# Patient Record
Sex: Male | Born: 1955 | Race: White | Hispanic: No | Marital: Married | Smoking: Never smoker
Health system: Southern US, Community
[De-identification: ages and names within clinical notes are randomized; demographics above are authoritative.]

## PROBLEM LIST (undated history)

## (undated) DIAGNOSIS — D049 Carcinoma in situ of skin, unspecified: Secondary | ICD-10-CM

## (undated) DIAGNOSIS — R040 Epistaxis: Secondary | ICD-10-CM

## (undated) DIAGNOSIS — M1990 Unspecified osteoarthritis, unspecified site: Secondary | ICD-10-CM

## (undated) DIAGNOSIS — N400 Enlarged prostate without lower urinary tract symptoms: Secondary | ICD-10-CM

## (undated) DIAGNOSIS — I10 Essential (primary) hypertension: Secondary | ICD-10-CM

## (undated) DIAGNOSIS — N200 Calculus of kidney: Secondary | ICD-10-CM

## (undated) DIAGNOSIS — K219 Gastro-esophageal reflux disease without esophagitis: Secondary | ICD-10-CM

## (undated) HISTORY — DX: Calculus of kidney: N20.0

## (undated) HISTORY — DX: Carcinoma in situ of skin, unspecified: D04.9

## (undated) HISTORY — PX: WISDOM TOOTH EXTRACTION: SHX21

## (undated) HISTORY — DX: Essential (primary) hypertension: I10

## (undated) HISTORY — PX: BASAL CELL CARCINOMA EXCISION: SHX1214

## (undated) HISTORY — DX: Epistaxis: R04.0

---

## 2012-09-16 ENCOUNTER — Encounter (HOSPITAL_COMMUNITY)
Admission: RE | Admit: 2012-09-16 | Discharge: 2012-09-16 | Disposition: A | Discharge status: Home / Self Care | Payer: BC Managed Care – PPO | Source: Ambulatory Visit | Attending: Urology | Admitting: Urology

## 2012-09-16 ENCOUNTER — Ambulatory Visit (HOSPITAL_COMMUNITY)
Admission: RE | Admit: 2012-09-16 | Discharge: 2012-09-16 | Disposition: A | Discharge status: Home / Self Care | Payer: BC Managed Care – PPO | Source: Ambulatory Visit | Attending: Urology | Admitting: Urology

## 2012-09-16 ENCOUNTER — Other Ambulatory Visit: Payer: Self-pay

## 2012-09-16 ENCOUNTER — Other Ambulatory Visit (HOSPITAL_COMMUNITY): Payer: Self-pay | Admitting: Urology

## 2012-09-16 ENCOUNTER — Encounter (HOSPITAL_COMMUNITY): Payer: Self-pay

## 2012-09-16 ENCOUNTER — Encounter (HOSPITAL_COMMUNITY): Payer: Self-pay | Admitting: Pharmacy Technician

## 2012-09-16 DIAGNOSIS — N201 Calculus of ureter: Secondary | ICD-10-CM

## 2012-09-16 DIAGNOSIS — R1032 Left lower quadrant pain: Secondary | ICD-10-CM | POA: Insufficient documentation

## 2012-09-16 HISTORY — DX: Benign prostatic hyperplasia without lower urinary tract symptoms: N40.0

## 2012-09-16 HISTORY — DX: Gastro-esophageal reflux disease without esophagitis: K21.9

## 2012-09-16 HISTORY — DX: Unspecified osteoarthritis, unspecified site: M19.90

## 2012-09-16 LAB — BASIC METABOLIC PANEL
Calcium: 9.8 mg/dL (ref 8.4–10.5)
GFR calc non Af Amer: 67 mL/min — ABNORMAL LOW (ref >90)
Glucose, Bld: 141 mg/dL — ABNORMAL HIGH (ref 70–99)
Sodium: 138 mEq/L (ref 135–145)

## 2012-09-16 LAB — SURGICAL PCR SCREEN: Staphylococcus aureus: NEGATIVE

## 2012-09-16 NOTE — Patient Instructions (Addendum)
Avan Gullett  09/16/2012   Your procedure is scheduled on:  09/17/12  Report to [facility] at 0700 AM.  Call this number if you have problems the morning of surgery: [phone]   Remember:   Do not eat food or drink liquids after midnight.   Take these medicines the morning of surgery with A SIP OF WATER: pain pill, prilosec   Do not wear jewelry, make-up or nail polish.  Do not wear lotions, powders, or perfumes. You may wear deodorant.  Do not shave 48 hours prior to surgery. Men may shave face and neck.  Do not bring valuables to the hospital.  [facility] is not responsible                   for any belongings or valuables.  Contacts, dentures or bridgework may not be worn into surgery.  Leave suitcase in the car. After surgery it may be brought to your room.  For patients admitted to the hospital, checkout time is 11:00 AM the day of  discharge.   Patients discharged the day of surgery will not be allowed to drive  home.  Name and phone number of your driver: family  Special Instructions: Shower using CHG 2 nights before surgery and the night before surgery.  If you shower the day of surgery use CHG.  Use special wash - you have one bottle of CHG for all showers.  You should use approximately 1/3 of the bottle for each shower.   Please read over the following fact sheets that you were given: Pain Booklet, Coughing and Deep Breathing, MRSA Information, Surgical Site Infection Prevention, Anesthesia Post-op Instructions and Care and Recovery After Surgery   PATIENT INSTRUCTIONS POST-ANESTHESIA  IMMEDIATELY FOLLOWING SURGERY:  Do not drive or operate machinery for the first twenty four hours after surgery.  Do not make any important decisions for twenty four hours after surgery or while taking narcotic pain medications or sedatives.  If you develop intractable nausea and vomiting or a severe headache please notify your doctor immediately.  FOLLOW-UP:  Please make an appointment with your  surgeon as instructed. You do not need to follow up with anesthesia unless specifically instructed to do so.  WOUND CARE INSTRUCTIONS (if applicable):  Keep a dry clean dressing on the anesthesia/puncture wound site if there is drainage.  Once the wound has quit draining you may leave it open to air.  Generally you should leave the bandage intact for twenty four hours unless there is drainage.  If the epidural site drains for more than 36-48 hours please call the anesthesia department.  QUESTIONS?:  Please feel free to call your physician or the hospital operator if you have any questions, and they will be happy to assist you.      Cystoscopy Cystoscopy is a procedure that is used to help your caregiver diagnose and sometimes treat conditions that affect your lower urinary tract. Your lower urinary tract includes your bladder and the tube through which urine passes from your bladder out of your body (urethra). Cystoscopy is performed with a thin, tube-shaped instrument (cystoscope). The cystoscope has lenses and a light at the end so that your caregiver can see inside your bladder. The cystoscope is inserted at the entrance of your urethra. Your caregiver guides it through your urethra and into your bladder. There are two main types of cystoscopy:  Flexible cystoscopy (with a flexible cystoscope).  Rigid cystoscopy (with a rigid cystoscope). Cystoscopy may be recommended  for many conditions, including:  Urinary tract infections.  Blood in your urine (hematuria).  Loss of bladder control (urinary incontinence) or overactive bladder.  Unusual cells found in a urine sample.  Urinary blockage.  Painful urination. Cystoscopy may also be done to remove a sample of your tissue to be checked under a microscope (biopsy). It may also be done to remove or destroy bladder stones. LET YOUR CAREGIVER KNOW ABOUT:  Allergies to food or medicine.  Medicines taken, including vitamins, herbs, eyedrops,  over-the-counter medicines, and creams.  Use of steroids (by mouth or creams).  Previous problems with anesthetics or numbing medicines.  History of bleeding problems or blood clots.  Previous surgery.  Other health problems, including diabetes and kidney problems.  Possibility of pregnancy, if this applies. PROCEDURE The area around the opening to your urethra will be cleaned. A medicine to numb your urethra (local anesthetic) is used. If a tissue sample or stone is removed during the procedure, you may be given a medicine to make you sleep (general anesthetic). Your caregiver will gently insert the tip of the cystoscope into your urethra. The cystoscope will be slowly glided through your urethra and into your bladder. Sterile fluid will flow through the cystoscope and into your bladder. The fluid will expand and stretch your bladder. This gives your caregiver a better view of your bladder walls. The procedure lasts about 15 20 minutes. AFTER THE PROCEDURE If a local anesthetic is used, you will be allowed to go home as soon as you are ready. If a general anesthetic is used, you will be taken to a recovery area until you are stable. You may have temporary bleeding and burning on urination. Document Released: 03/31/2000 Document Revised: 12/27/2011 Document Reviewed: 09/25/2011 [facility] Patient Information 2014 [place], [place].

## 2012-09-17 ENCOUNTER — Ambulatory Visit (HOSPITAL_COMMUNITY): Payer: BC Managed Care – PPO | Admitting: Anesthesiology

## 2012-09-17 ENCOUNTER — Ambulatory Visit (HOSPITAL_COMMUNITY): Payer: BC Managed Care – PPO

## 2012-09-17 ENCOUNTER — Encounter (HOSPITAL_COMMUNITY): Payer: Self-pay | Admitting: Anesthesiology

## 2012-09-17 ENCOUNTER — Encounter (HOSPITAL_COMMUNITY)
Admission: RE | Disposition: A | Discharge status: Home / Self Care | Payer: Self-pay | Source: Ambulatory Visit | Attending: Urology

## 2012-09-17 ENCOUNTER — Ambulatory Visit (HOSPITAL_COMMUNITY)
Admission: RE | Admit: 2012-09-17 | Discharge: 2012-09-17 | Disposition: A | Discharge status: Home / Self Care | Payer: BC Managed Care – PPO | Source: Ambulatory Visit | Attending: Urology | Admitting: Urology

## 2012-09-17 ENCOUNTER — Encounter (HOSPITAL_COMMUNITY): Payer: Self-pay | Admitting: *Deleted

## 2012-09-17 DIAGNOSIS — Z01812 Encounter for preprocedural laboratory examination: Secondary | ICD-10-CM | POA: Insufficient documentation

## 2012-09-17 DIAGNOSIS — N201 Calculus of ureter: Secondary | ICD-10-CM | POA: Insufficient documentation

## 2012-09-17 HISTORY — PX: HOLMIUM LASER APPLICATION: SHX5852

## 2012-09-17 HISTORY — PX: STONE EXTRACTION WITH BASKET: SHX5318

## 2012-09-17 HISTORY — PX: CYSTOSCOPY W/ URETERAL STENT PLACEMENT: SHX1429

## 2012-09-17 HISTORY — PX: BALLOON DILATION: SHX5330

## 2012-09-17 HISTORY — PX: URETEROSCOPY: SHX842

## 2012-09-17 SURGERY — CYSTOSCOPY, WITH RETROGRADE PYELOGRAM AND URETERAL STENT INSERTION
Anesthesia: General | Site: Ureter | Laterality: Left | Wound class: Clean Contaminated

## 2012-09-17 MED ORDER — PROPOFOL 10 MG/ML IV EMUL
INTRAVENOUS | Status: AC
  Filled 2012-09-17: qty 20

## 2012-09-17 MED ORDER — MIDAZOLAM HCL 2 MG/2ML IJ SOLN
1.0000 mg | INTRAMUSCULAR | Status: DC | PRN
  Administered 2012-09-17 (×2): 2 mg via INTRAVENOUS

## 2012-09-17 MED ORDER — OXYCODONE-ACETAMINOPHEN 5-325 MG PO TABS
ORAL_TABLET | ORAL | Status: AC
  Filled 2012-09-17: qty 1

## 2012-09-17 MED ORDER — FENTANYL CITRATE 0.05 MG/ML IJ SOLN
25.0000 ug | INTRAMUSCULAR | Status: DC | PRN
  Administered 2012-09-17 (×2): 50 ug via INTRAVENOUS

## 2012-09-17 MED ORDER — FENTANYL CITRATE 0.05 MG/ML IJ SOLN
INTRAMUSCULAR | Status: AC
  Filled 2012-09-17: qty 2

## 2012-09-17 MED ORDER — IOHEXOL 350 MG/ML SOLN
INTRAVENOUS | Status: DC | PRN
  Administered 2012-09-17: 50 mL

## 2012-09-17 MED ORDER — ARTIFICIAL TEARS OP OINT: TOPICAL_OINTMENT | OPHTHALMIC | Status: DC | PRN

## 2012-09-17 MED ORDER — ONDANSETRON HCL 4 MG/2ML IJ SOLN: 4.0000 mg | Freq: Once | INTRAMUSCULAR | Status: DC | PRN

## 2012-09-17 MED ORDER — MIDAZOLAM HCL 2 MG/2ML IJ SOLN
INTRAMUSCULAR | Status: AC
  Filled 2012-09-17: qty 2

## 2012-09-17 MED ORDER — CIPROFLOXACIN IN D5W 400 MG/200ML IV SOLN
INTRAVENOUS | Status: DC | PRN
  Administered 2012-09-17: 200 mg via INTRAVENOUS

## 2012-09-17 MED ORDER — LIDOCAINE HCL (PF) 1 % IJ SOLN
INTRAMUSCULAR | Status: AC
  Filled 2012-09-17: qty 5

## 2012-09-17 MED ORDER — ONDANSETRON HCL 4 MG/2ML IJ SOLN
4.0000 mg | Freq: Once | INTRAMUSCULAR | Status: AC
  Administered 2012-09-17: 4 mg via INTRAVENOUS

## 2012-09-17 MED ORDER — CIPROFLOXACIN IN D5W 200 MG/100ML IV SOLN
INTRAVENOUS | Status: AC
  Filled 2012-09-17: qty 100

## 2012-09-17 MED ORDER — ONDANSETRON HCL 4 MG/2ML IJ SOLN
INTRAMUSCULAR | Status: AC
  Filled 2012-09-17: qty 2

## 2012-09-17 MED ORDER — SODIUM CHLORIDE 0.9 % IR SOLN
Status: DC | PRN
  Administered 2012-09-17 (×2): 3000 mL

## 2012-09-17 MED ORDER — PROPOFOL 10 MG/ML IV BOLUS
INTRAVENOUS | Status: DC | PRN
  Administered 2012-09-17: 150 mg via INTRAVENOUS
  Administered 2012-09-17: 50 mg via INTRAVENOUS

## 2012-09-17 MED ORDER — STERILE WATER FOR IRRIGATION IR SOLN
Status: DC | PRN
  Administered 2012-09-17: 1000 mL

## 2012-09-17 MED ORDER — LACTATED RINGERS IV SOLN
INTRAVENOUS | Status: DC
  Administered 2012-09-17: 08:00:00 via INTRAVENOUS

## 2012-09-17 MED ORDER — OXYCODONE-ACETAMINOPHEN 5-325 MG PO TABS
1.0000 | ORAL_TABLET | Freq: Once | ORAL | Status: AC
  Administered 2012-09-17: 1 via ORAL

## 2012-09-17 MED ORDER — FENTANYL CITRATE 0.05 MG/ML IJ SOLN
INTRAMUSCULAR | Status: DC | PRN
  Administered 2012-09-17 (×8): 25 ug via INTRAVENOUS

## 2012-09-17 MED ORDER — LACTATED RINGERS IV SOLN
INTRAVENOUS | Status: DC | PRN
  Administered 2012-09-17 (×2): via INTRAVENOUS

## 2012-09-17 MED ORDER — ARTIFICIAL TEARS OP OINT
TOPICAL_OINTMENT | OPHTHALMIC | Status: AC
  Filled 2012-09-17: qty 3.5

## 2012-09-17 MED ORDER — LIDOCAINE HCL (CARDIAC) 20 MG/ML IV SOLN
INTRAVENOUS | Status: DC | PRN
  Administered 2012-09-17: 50 mg via INTRAVENOUS

## 2012-09-17 SURGICAL SUPPLY — 23 items
BAG DRAIN URO TABLE W/ADPT NS (DRAPE) ×3 IMPLANT
CATH 5 FR WEDGE TIP (UROLOGICAL SUPPLIES) ×3 IMPLANT
CATH OPEN TIP 5FR (CATHETERS) ×3 IMPLANT
CLOTH BEACON ORANGE TIMEOUT ST (SAFETY) ×3 IMPLANT
DILATOR UROMAX ULTRA (MISCELLANEOUS) ×3 IMPLANT
GLOVE BIO SURGEON STRL SZ7 (GLOVE) ×3 IMPLANT
GLOVE BIOGEL PI IND STRL 6.5 (GLOVE) ×4 IMPLANT
GLOVE BIOGEL PI IND STRL 7.0 (GLOVE) ×4 IMPLANT
GLOVE BIOGEL PI INDICATOR 6.5 (GLOVE) ×2
GLOVE BIOGEL PI INDICATOR 7.0 (GLOVE) ×2
GLOVE ECLIPSE 6.5 STRL STRAW (GLOVE) ×3 IMPLANT
GOWN STRL REIN XL XLG (GOWN DISPOSABLE) ×6 IMPLANT
IV NS IRRIG 3000ML ARTHROMATIC (IV SOLUTION) ×6 IMPLANT
KIT ROOM TURNOVER AP CYSTO (KITS) ×3 IMPLANT
LASER FIBER DISP (UROLOGICAL SUPPLIES) ×3 IMPLANT
LASER FIBER DISP 1000U (UROLOGICAL SUPPLIES) IMPLANT
MANIFOLD NEPTUNE II (INSTRUMENTS) ×3 IMPLANT
PACK CYSTO (CUSTOM PROCEDURE TRAY) ×3 IMPLANT
PAD ARMBOARD 7.5X6 YLW CONV (MISCELLANEOUS) ×3 IMPLANT
STENT PERCUFLEX 4.8FRX24 (STENTS) IMPLANT
STONE RETRIEVAL GEMINI 2.4 FR (MISCELLANEOUS) IMPLANT
TOWEL OR 17X26 4PK STRL BLUE (TOWEL DISPOSABLE) ×3 IMPLANT
WIRE GUIDE BENTSON .035 15CM (WIRE) ×3 IMPLANT

## 2012-09-17 NOTE — Brief Op Note (Signed)
09/17/2012  9:49 AM  PATIENT:  [name]  57 y.o. male  PRE-OPERATIVE DIAGNOSIS:  left ureteral calculus  POST-OPERATIVE DIAGNOSIS:  left ureteral calculus  PROCEDURE:  Procedure(s) with comments: CYSTOSCOPY WITH RETROGRADE PYELOGRAM/URETERAL STENT PLACEMENT (Left) - string attached to stent STONE EXTRACTION WITH BASKET (Left) HOLMIUM LASER APPLICATION (Left)  SURGEON:  Surgeon(s) and Role:    * Ky Barban, MD - Primary  PHYSICIAN ASSISTANT:   ASSISTANTS: none   ANESTHESIA:   general  EBL:  Total I/O In: 1100 [I.V.:1100] Out: -   BLOOD ADMINISTERED:none  DRAINS: double j stent with string size 35f 24cm   LOCAL MEDICATIONS USED:  NONE  SPECIMEN:  Source of Specimen:  ureteral calculus given to the family to bring in office so it can be sent to the lab.  DISPOSITION OF SPECIMEN:  N/A  COUNTS:    TOURNIQUET:  * No tourniquets in log *  DICTATION: .Other Dictation: Dictation Number [id number]  PLAN OF CARE: Discharge to home after PACU  PATIENT DISPOSITION:  PACU - hemodynamically stable.   Delay start of Pharmacological VTE agent (>24hrs) due to surgical blood loss or risk of bleeding:

## 2012-09-17 NOTE — H&P (Signed)
[name], [name]               ACCOUNT NO.:  [id number]  MEDICAL RECORD NO.:  [id number]  LOCATION:                                 FACILITY:  PHYSICIAN:  Ky Barban, M.D.DATE OF BIRTH:  1956-02-05  DATE OF ADMISSION:  09/17/2012 DATE OF DISCHARGE:  LH                             HISTORY & PHYSICAL   CHIEF COMPLAINT:  Recurrent left renal colic.  HISTORY:  A 57 year old gentleman who for the last 1 week on and off having pain in his left flank.  No nausea, vomiting, fever, or chills. He never had any kidney stones before.  He went to [facility] last Saturday and had a CT scan done that showed there is a 6 mm stone in the distal left ureter causing partial obstruction.  He is still having considerable pain and wants something done about the stone.  PAST MEDICAL HISTORY:  No history of diabetes or hypertension.  FAMILY HISTORY:  Father had kidney stones.  PERSONAL HISTORY:  Does not smoke or drink.  REVIEW OF SYSTEMS:  Does not smoke or drink.  Review of systems unremarkable.  PHYSICAL EXAMINATION:  VITAL SIGNS:  Blood pressure 129/77, temperature 98.4. ABDOMEN:  Soft, flat.  Liver, spleen, kidneys not palpable.  No CVA tenderness. GU:  External genitalia is uncircumcised.  Meatus adequate.  Testicles are normal. RECTAL:  Prostate 0.5+ smooth and firm.  IMPRESSION:  Distal left ureteral calculus.  __________ score is 19 because he is having lot of frequency, urgency.  I think it is probably because of the stone.  It just started after he had this stone.  He just had a KUB done and the stone was not visible on KUB.  So, I told him that the other option will be to do a stone basket.  Then I went over the procedure of stone basket, discussed his limitation and complications in detail.  I discussed: 1. That for the possibility of perforation of the ureter and during     the procedure, then requiring open surgery, which will require     longer period  of recovery and will have to stay in the hospital. 2. Stone migration.  In that case, I will not be able to remove the     stone if I can find it in the kidney and then the double-J stent     come out.  The use of double-J stent was also discussed in detail.     He understands and want me to schedule as soon as possible.  We     will schedule it for tomorrow, gave him Percocet 1 q.6 h. p.r.n.     #30.  I told him if he passes the stone to let me know.  It is a 6     mm stone, I told him that if it was smaller, then probably I will     not do anything.  There is always some chance that he can pass even     6 mm stone, but he does not want to wait and see if that happens.     The daughter was asking me about the machine, which  can blast the     stone.  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  She understands, they will go ahead     and schedule him for tomorrow.  If there is still __________ I told     him to let me know.     Ky Barban, M.D.     MIJ/MEDQ  D:  09/16/2012  T:  09/17/2012  Job:  [id number]

## 2012-09-17 NOTE — Anesthesia Procedure Notes (Signed)
Procedure Name: LMA Insertion Date/Time: 09/17/2012 8:41 AM Performed by: Carolyne Littles, AMY L Pre-anesthesia Checklist: Patient identified, Timeout performed, Emergency Drugs available, Suction available and Patient being monitored Patient Re-evaluated:Patient Re-evaluated prior to inductionOxygen Delivery Method: Circle system utilized Intubation Type: IV induction Ventilation: Mask ventilation without difficulty LMA: LMA inserted LMA Size: 5.0 Number of attempts: 1 Placement Confirmation: positive ETCO2 and breath sounds checked- equal and bilateral Tube secured with: Tape Dental Injury: Teeth and Oropharynx as per pre-operative assessment

## 2012-09-17 NOTE — Preoperative (Signed)
Beta Blockers   Reason not to administer Beta Blockers:Not Applicable 

## 2012-09-17 NOTE — Anesthesia Preprocedure Evaluation (Addendum)
Anesthesia Evaluation  Patient identified by MRN, date of birth, ID band Patient awake    Reviewed: Allergy & Precautions, H&P , NPO status , Patient's Chart, lab work & pertinent test results  Airway Mallampati: II TM Distance: >3 FB Neck ROM: Full    Dental   Pulmonary neg pulmonary ROS,  breath sounds clear to auscultation        Cardiovascular negative cardio ROS  Rhythm:Regular Rate:Normal     Neuro/Psych    GI/Hepatic GERD- (resolved, none x mponths)  Medicated and Controlled,  Endo/Other    Renal/GU      Musculoskeletal   Abdominal   Peds  Hematology   Anesthesia Other Findings   Reproductive/Obstetrics                           Anesthesia Physical Anesthesia Plan  ASA: II  Anesthesia Plan: General   Post-op Pain Management:    Induction: Intravenous  Airway Management Planned: LMA  Additional Equipment:   Intra-op Plan:   Post-operative Plan: Extubation in OR  Informed Consent: I have reviewed the patients History and Physical, chart, labs and discussed the procedure including the risks, benefits and alternatives for the proposed anesthesia with the patient or authorized representative who has indicated his/her understanding and acceptance.     Plan Discussed with:   Anesthesia Plan Comments:         Anesthesia Quick Evaluation

## 2012-09-17 NOTE — Anesthesia Postprocedure Evaluation (Signed)
  Anesthesia Post-op Note  Patient: Chauncy Mangiaracina Oren  Procedure(s) Performed: Procedure(s) with comments: CYSTOSCOPY WITH RETROGRADE PYELOGRAM/URETERAL STENT PLACEMENT (Left) - string attached to stent STONE EXTRACTION WITH BASKET (Left) HOLMIUM LASER APPLICATION (Left)  Patient Location: PACU  Anesthesia Type:General  Level of Consciousness: sedated and patient cooperative  Airway and Oxygen Therapy: Patient Spontanous Breathing and Patient connected to face mask oxygen  Post-op Pain: mild  Post-op Assessment: Post-op Vital signs reviewed, Patient's Cardiovascular Status Stable, Respiratory Function Stable, Patent Airway, No signs of Nausea or vomiting and Pain level controlled  Post-op Vital Signs: Reviewed and stable  Complications: No apparent anesthesia complications

## 2012-09-17 NOTE — Transfer of Care (Signed)
Immediate Anesthesia Transfer of Care Note  Patient: Curby Carswell Kaeding  Procedure(s) Performed: Procedure(s) with comments: CYSTOSCOPY WITH RETROGRADE PYELOGRAM/URETERAL STENT PLACEMENT (Left) - string attached to stent STONE EXTRACTION WITH BASKET (Left) HOLMIUM LASER APPLICATION (Left)  Patient Location: PACU  Anesthesia Type:General  Level of Consciousness: sedated and patient cooperative  Airway & Oxygen Therapy: Patient Spontanous Breathing and Patient connected to face mask oxygen  Post-op Assessment: Report given to PACU RN and Post -op Vital signs reviewed and stable  Post vital signs: Reviewed and stable  Complications: No apparent anesthesia complications

## 2012-09-17 NOTE — Progress Notes (Signed)
No change in H&P on reexamination. 

## 2012-09-18 LAB — URINE CULTURE: Culture: NO GROWTH

## 2012-09-18 NOTE — Op Note (Signed)
NAMEJEARLD, [name]               ACCOUNT NO.:  [id number]  MEDICAL RECORD NO.:  [id number]  LOCATION:                                 FACILITY:  PHYSICIAN:  Ky Barban, M.D.DATE OF BIRTH:  [birth year]  DATE OF PROCEDURE:  09/17/2012 DATE OF DISCHARGE:  09/17/2012                              OPERATIVE REPORT   PREOPERATIVE DIAGNOSIS:  Left distal ureteral calculus.  POSTOPERATIVE DIAGNOSIS:  Left distal ureteral calculus.  PROCEDURE:  Cystoscopy, left retrograde pyelogram, ureteroscopic stone basket extraction, holmium laser lithotripsy, insertion of double-J stent with string attached.  ANESTHESIA:  General.  PROCEDURE IN DETAIL:  The patient under general endotracheal anesthesia in lithotomy position.  After usual prep and drape, a #25 cystoscope introduced into the bladder.  Left ureteral orifice identified, catheterized with a wedge catheter, Hypaque was injected under fluoroscopic control.  The dye goes up into the upper ureter.  The distal ureter was dilated, and at one point, there appears to be questionable filling defect, that is where the stone is.  Now, the Wedge catheter was removed.  The open-end catheter was introduced into the left ureteral orifice.  Through that, [facility] guidewire was passed up into the renal pelvis, and the open-end catheter was removed.  Now, over the Bentson guidewire, I introduced 15 balloon, dilated the intramural ureter.  Once it was dilated, the balloon was removed leaving the guidewire in place.  Cystoscope was removed.  Now, I introduced short rigid ureteroscope alongside the guidewire, went to the level of the stone.  Stone was visualized and then a basket was passed above the stone and was engaged in the basket and the stone was then broken with the help of holmium laser.  After the stone was broken, the stone pieces were taken out with the help of the basket.  No complications. At the end of the procedure, the ureter looks  fine.  I thought maybe the stone went up into the upper ureter.  I looked up all the way up into the upper ureter.  There is no other stone, so decided to leave the double-J stent in place.  Over the guidewire, 5-French 24 cm double-J stent with a string was left in place and a nice loop was obtained in the renal pelvis and the bladder.  The ureteroscope and the cystoscope was removed.  The string was taped to his private area.  The patient left the operating room in satisfactory condition.     Ky Barban, M.D.    MIJ/MEDQ  D:  09/17/2012  T:  09/17/2012  Job:  [id number]

## 2012-09-20 ENCOUNTER — Encounter (HOSPITAL_COMMUNITY): Payer: Self-pay | Admitting: Urology

## 2013-12-22 IMAGING — CR DG ABDOMEN 1V
2 series · 2 of 2 positions shown · non-contrast
Comparison: CT scan of September 14, 2012.

CLINICAL DATA: Left ureteral calculus.

ABDOMEN - 1 VIEW

[view not recorded (1 of 2)]
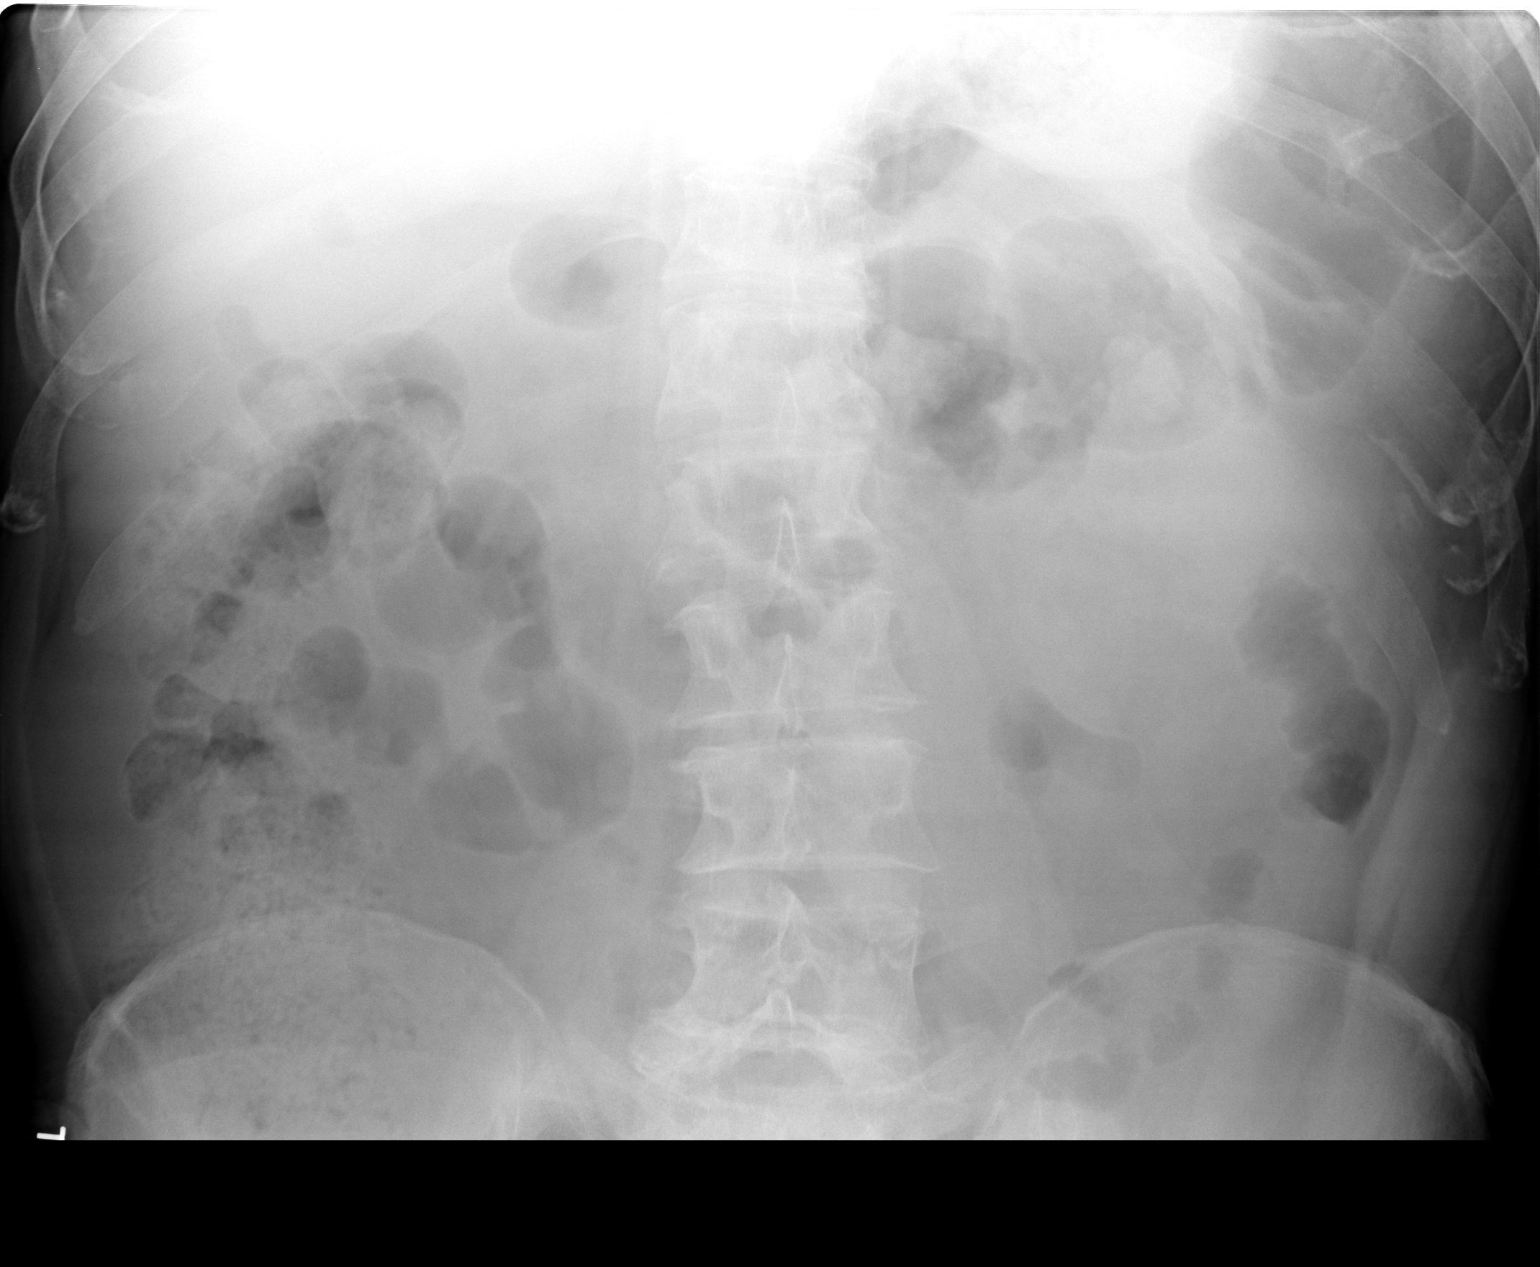

[view not recorded (2 of 2)]
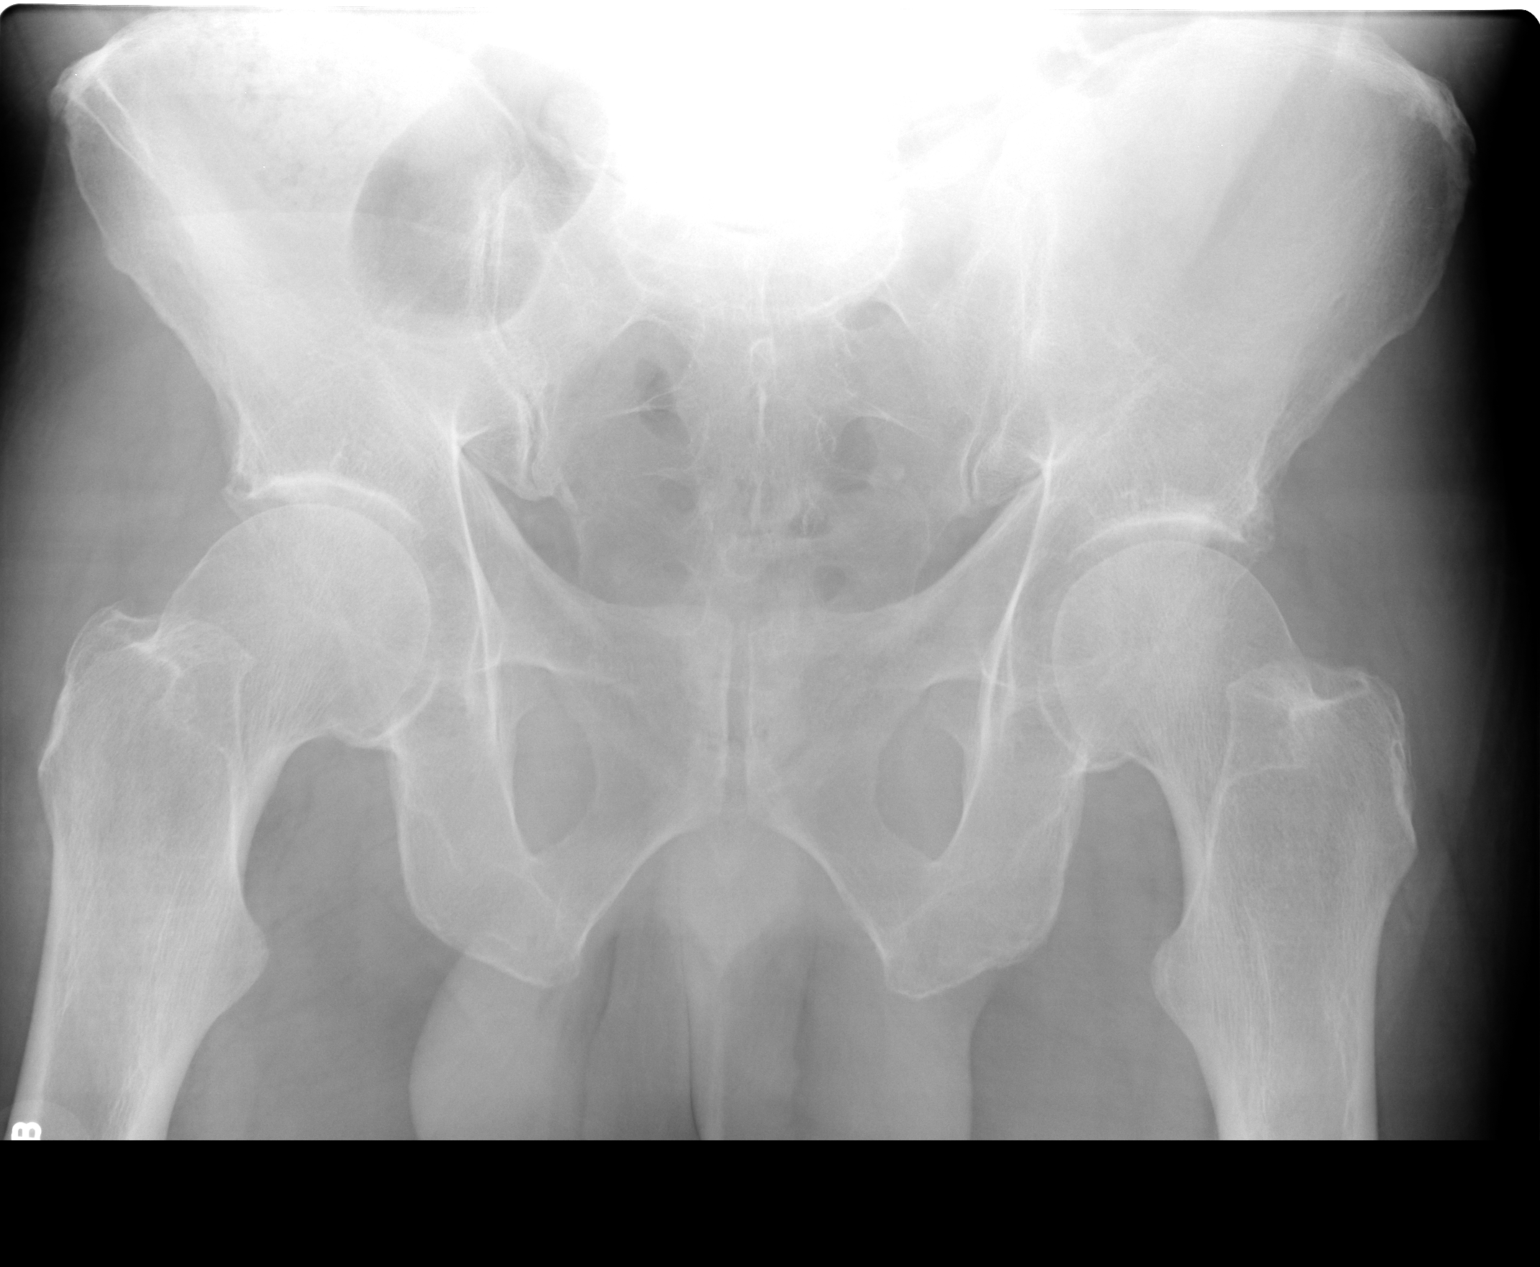

[2 of 2 positions shown; findings below may reference images not displayed]

FINDINGS: No abnormal bowel gas pattern is noted.  Stool is noted
in right colon.  No definite bony abnormality is noted.  Small
rounded calcification is seen in the left side of the pelvis
projected over the midportion of the sacrum which may represent
distal ureteral calculus as described on prior CT scan.
IMPRESSION: Small rounded calcification seen projected over midportion of left
sacrum which most likely corresponds to the distal ureteral
calculus noted on prior CT scan.  No evidence of bowel obstruction
or ileus.

## 2017-08-01 ENCOUNTER — Encounter: Payer: Self-pay | Admitting: Gastroenterology

## 2017-09-14 ENCOUNTER — Ambulatory Visit (AMBULATORY_SURGERY_CENTER): Payer: Self-pay | Admitting: *Deleted

## 2017-09-14 ENCOUNTER — Other Ambulatory Visit: Payer: Self-pay

## 2017-09-14 VITALS — Ht 73.0 in | Wt 225.0 lb

## 2017-09-14 DIAGNOSIS — Z1211 Encounter for screening for malignant neoplasm of colon: Secondary | ICD-10-CM

## 2017-09-14 MED ORDER — PEG 3350-KCL-NA BICARB-NACL 420 G PO SOLR: 4000.0000 mL | Freq: Once | ORAL | 0 refills | Status: AC

## 2017-09-14 NOTE — Progress Notes (Signed)
No egg or soy allergy known to patient  No issues with past sedation with any surgeries  or procedures, no intubation problems  No diet pills per patient No home 02 use per patient  No blood thinners per patient  Pt denies issues with constipation  No A fib or A flutter  EMMI video sent to pt's e mail  

## 2017-09-21 ENCOUNTER — Encounter: Payer: Self-pay | Admitting: Gastroenterology

## 2017-09-28 ENCOUNTER — Encounter: Payer: Self-pay | Admitting: Gastroenterology

## 2017-09-28 ENCOUNTER — Other Ambulatory Visit: Payer: Self-pay

## 2017-09-28 ENCOUNTER — Ambulatory Visit (AMBULATORY_SURGERY_CENTER): Payer: BLUE CROSS/BLUE SHIELD | Admitting: Gastroenterology

## 2017-09-28 VITALS — BP 119/70 | HR 59 | Temp 97.8°F | Resp 24 | Ht 73.0 in | Wt 225.0 lb

## 2017-09-28 DIAGNOSIS — Z1211 Encounter for screening for malignant neoplasm of colon: Secondary | ICD-10-CM

## 2017-09-28 DIAGNOSIS — K5730 Diverticulosis of large intestine without perforation or abscess without bleeding: Secondary | ICD-10-CM | POA: Diagnosis not present

## 2017-09-28 MED ORDER — SODIUM CHLORIDE 0.9 % IV SOLN: 500.0000 mL | Freq: Once | INTRAVENOUS | Status: AC

## 2017-09-28 NOTE — Progress Notes (Signed)
Report given to PACU, vss 

## 2017-09-28 NOTE — Progress Notes (Signed)
Pt. Reports no change in his medical or surgical history since his pre-visit 09/14/2017. 

## 2017-09-28 NOTE — Patient Instructions (Signed)
**   Handout given on diverticulosis **   YOU HAD AN ENDOSCOPIC PROCEDURE TODAY AT THE [facility]:   Refer to the procedure report that was given to you for any specific questions about what was found during the examination.  If the procedure report does not answer your questions, please call your gastroenterologist to clarify.  If you requested that your care partner not be given the details of your procedure findings, then the procedure report has been included in a sealed envelope for you to review at your convenience later.  YOU SHOULD EXPECT: Some feelings of bloating in the abdomen. Passage of more gas than usual.  Walking can help get rid of the air that was put into your GI tract during the procedure and reduce the bloating. If you had a lower endoscopy (such as a colonoscopy or flexible sigmoidoscopy) you may notice spotting of blood in your stool or on the toilet paper. If you underwent a bowel prep for your procedure, you may not have a normal bowel movement for a few days.  Please Note:  You might notice some irritation and congestion in your nose or some drainage.  This is from the oxygen used during your procedure.  There is no need for concern and it should clear up in a day or so.  SYMPTOMS TO REPORT IMMEDIATELY:   Following lower endoscopy (colonoscopy or flexible sigmoidoscopy):  Excessive amounts of blood in the stool  Significant tenderness or worsening of abdominal pains  Swelling of the abdomen that is new, acute  Fever of 100F or higher  For urgent or emergent issues, a gastroenterologist can be reached at any hour by calling [phone].   DIET:  We do recommend a small meal at first, but then you may proceed to your regular diet.  Drink plenty of fluids but you should avoid alcoholic beverages for 24 hours.  ACTIVITY:  You should plan to take it easy for the rest of today and you should NOT DRIVE or use heavy machinery until tomorrow (because of the  sedation medicines used during the test).    FOLLOW UP: Our staff will call the number listed on your records the next business day following your procedure to check on you and address any questions or concerns that you may have regarding the information given to you following your procedure. If we do not reach you, we will leave a message.  However, if you are feeling well and you are not experiencing any problems, there is no need to return our call.  We will assume that you have returned to your regular daily activities without incident.  If any biopsies were taken you will be contacted by phone or by letter within the next 1-3 weeks.  Please call us at [phone] if you have not heard about the biopsies in 3 weeks.    SIGNATURES/CONFIDENTIALITY: You and/or your care partner have signed paperwork which will be entered into your electronic medical record.  These signatures attest to the fact that that the information above on your After Visit Summary has been reviewed and is understood.  Full responsibility of the confidentiality of this discharge information lies with you and/or your care-partner. 

## 2017-09-28 NOTE — Op Note (Signed)
[facility] Patient Name: Albert CrazierJerry Neumeier Procedure Date: 09/28/2017 12:46 PM MRN: [id number] Endoscopist: Rachael Feeaniel P Eleonora Peeler , MD Age: 62 Referring MD:  Date of Birth: [birth year] Gender: Male Account #: [id number] Procedure:                Colonoscopy Indications:              Screening for colorectal malignant neoplasm Medicines:                Monitored Anesthesia Care Procedure:                Pre-Anesthesia Assessment:                           - Prior to the procedure, a History and Physical                            was performed, and patient medications and                            allergies were reviewed. The patient's tolerance of                            previous anesthesia was also reviewed. The risks                            and benefits of the procedure and the sedation                            options and risks were discussed with the patient.                            All questions were answered, and informed consent                            was obtained. Prior Anticoagulants: The patient has                            taken no previous anticoagulant or antiplatelet                            agents. After reviewing the risks and benefits, the                            patient was deemed in satisfactory condition to                            undergo the procedure.                           After obtaining informed consent, the colonoscope                            was passed under direct vision. Throughout the  procedure, the patient's blood pressure, pulse, and                            oxygen saturations were monitored continuously. The                            Colonoscope was introduced through the anus and                            advanced to the the cecum, identified by                            appendiceal orifice and ileocecal valve. The                            colonoscopy was performed without difficulty.  The                            patient tolerated the procedure well. The quality                            of the bowel preparation was good. The ileocecal                            valve, appendiceal orifice, and rectum were                            photographed. Scope In: 1:39:26 PM Scope Out: 1:50:10 PM Scope Withdrawal Time: 0 hours 7 minutes 25 seconds  Total Procedure Duration: 0 hours 10 minutes 44 seconds  Findings:                 Multiple small-mouthed diverticula were found in                            the left colon.                           The exam was otherwise without abnormality on                            direct and retroflexion views. Complications:            No immediate complications. Estimated blood loss:                            None. Estimated Blood Loss:     Estimated blood loss: none. Impression:               - Diverticulosis in the left colon.                           - The examination was otherwise normal on direct                            and retroflexion views.                           -  No polyps or cancers. Recommendation:           - Patient has a contact number available for                            emergencies. The signs and symptoms of potential                            delayed complications were discussed with the                            patient. Return to normal activities tomorrow.                            Written discharge instructions were provided to the                            patient.                           - Resume previous diet.                           - Continue present medications.                           - Repeat colonoscopy in 10 years for screening                            purposes. There is no need for colon cancer                            screening by any method (including stool testing)                            prior to then. Rachael Fee, MD 09/28/2017 1:54:15 PM This report has been  signed electronically.

## 2017-10-01 ENCOUNTER — Telehealth: Payer: Self-pay

## 2017-10-01 NOTE — Telephone Encounter (Signed)
  Follow up Call-  Call back number 09/28/2017  Post procedure Call Back phone  # [phone]  Permission to leave phone message Yes  Some recent data might be hidden     Patient questions:  Do you have a fever, pain , or abdominal swelling? No. Pain Score  0 *  Have you tolerated food without any problems? Yes.    Have you been able to return to your normal activities? Yes.    Do you have any questions about your discharge instructions: Diet   No. Medications  No. Follow up visit  No.  Do you have questions or concerns about your Care? No.  Actions: * If pain score is 4 or above: No action needed, pain <4.

## 2017-10-01 NOTE — Telephone Encounter (Signed)
NO ANSWER, MESSAGE LEFT FOR PATIENT. 

## 2023-01-31 ENCOUNTER — Encounter: Payer: Self-pay | Admitting: Family Medicine

## 2023-01-31 ENCOUNTER — Other Ambulatory Visit: Payer: Self-pay | Admitting: Family Medicine

## 2023-01-31 ENCOUNTER — Ambulatory Visit
Admission: RE | Admit: 2023-01-31 | Discharge: 2023-01-31 | Disposition: A | Discharge status: Home / Self Care | Payer: Medicare Other | Source: Ambulatory Visit | Attending: Family Medicine | Admitting: Family Medicine

## 2023-01-31 DIAGNOSIS — M25572 Pain in left ankle and joints of left foot: Secondary | ICD-10-CM
